# Patient Record
Sex: Female | Born: 1990 | Race: White | Hispanic: Yes | Marital: Single
Health system: Southern US, Community
[De-identification: ages and names within clinical notes are randomized; demographics above are authoritative.]

---

## 2018-04-02 ENCOUNTER — Encounter (HOSPITAL_COMMUNITY): Payer: Self-pay | Admitting: Emergency Medicine

## 2018-04-02 ENCOUNTER — Emergency Department (HOSPITAL_COMMUNITY)
Admission: EM | Admit: 2018-04-02 | Discharge: 2018-04-02 | Disposition: A | Discharge status: Home / Self Care | Payer: Medicaid - Out of State | Attending: Emergency Medicine | Admitting: Emergency Medicine

## 2018-04-02 ENCOUNTER — Emergency Department (HOSPITAL_COMMUNITY): Payer: Medicaid - Out of State

## 2018-04-02 ENCOUNTER — Other Ambulatory Visit: Payer: Self-pay

## 2018-04-02 DIAGNOSIS — R200 Anesthesia of skin: Secondary | ICD-10-CM | POA: Insufficient documentation

## 2018-04-02 DIAGNOSIS — Y998 Other external cause status: Secondary | ICD-10-CM | POA: Insufficient documentation

## 2018-04-02 DIAGNOSIS — W260XXA Contact with knife, initial encounter: Secondary | ICD-10-CM | POA: Insufficient documentation

## 2018-04-02 DIAGNOSIS — R202 Paresthesia of skin: Secondary | ICD-10-CM | POA: Diagnosis not present

## 2018-04-02 DIAGNOSIS — Y92010 Kitchen of single-family (private) house as the place of occurrence of the external cause: Secondary | ICD-10-CM | POA: Insufficient documentation

## 2018-04-02 DIAGNOSIS — S61211A Laceration without foreign body of left index finger without damage to nail, initial encounter: Secondary | ICD-10-CM | POA: Insufficient documentation

## 2018-04-02 DIAGNOSIS — Y93G1 Activity, food preparation and clean up: Secondary | ICD-10-CM | POA: Insufficient documentation

## 2018-04-02 DIAGNOSIS — Z23 Encounter for immunization: Secondary | ICD-10-CM | POA: Insufficient documentation

## 2018-04-02 DIAGNOSIS — S6992XA Unspecified injury of left wrist, hand and finger(s), initial encounter: Secondary | ICD-10-CM | POA: Diagnosis present

## 2018-04-02 MED ORDER — TETANUS-DIPHTH-ACELL PERTUSSIS 5-2.5-18.5 LF-MCG/0.5 IM SUSP
0.5000 mL | Freq: Once | INTRAMUSCULAR | Status: AC
  Administered 2018-04-02: 0.5 mL via INTRAMUSCULAR
  Filled 2018-04-02: qty 0.5

## 2018-04-02 MED ORDER — LIDOCAINE HCL 2 % IJ SOLN
10.0000 mL | Freq: Once | INTRAMUSCULAR | Status: AC
  Administered 2018-04-02: 200 mg via INTRADERMAL
  Filled 2018-04-02: qty 20

## 2018-04-02 NOTE — ED Notes (Signed)
Cut left index finger with a knife , bleeding controlled at this time, small lac to middle of finger pt states pain is a 9/10

## 2018-04-02 NOTE — ED Notes (Signed)
Patient verbalizes understanding of discharge instructions. Opportunity for questioning and answers were provided. Armband removed by staff, pt discharged from ED ambulatory.   

## 2018-04-02 NOTE — ED Notes (Signed)
ED Provider at bedside. 

## 2018-04-02 NOTE — ED Triage Notes (Addendum)
Patient complains of laceration to left index finger while cutting open a bag. 2cm laceration. Hemorrhage controlled. Patient alert, oriented, and in no apparent distress at this time.

## 2018-04-02 NOTE — Discharge Instructions (Signed)
1. Medications: Alternate 600 mg of ibuprofen and 500-1000 mg of Tylenol every 3 hours as needed for pain. Do not exceed 4000 mg of Tylenol daily.  Take ibuprofen with food to avoid upset stomach issues. 2. Treatment: ice for swelling, keep wound clean with warm soap and water and keep bandage dry, do not submerge in water for 24 hours 3. Follow Up: Please return in 7 days to have your stitches/staples removed or sooner if you have concerns.  You may also go to urgent care or your primary care physician.  Return to the emergency department sooner if any concerning signs or symptoms develop such as high fevers, redness, drainage of pus from the wound, or swelling.   WOUND CARE  Keep area clean and dry for 24 hours. Do not remove bandage, if applied.  After 24 hours, remove bandage and wash wound gently with mild soap and warm water. Reapply a new bandage after cleaning wound, if directed.   Continue daily cleansing with soap and water until stitches/staples are removed.  Do not apply any ointments or creams to the wound while stitches/staples are in place, as this may cause delayed healing. Return if you experience any of the following signs of infection: Swelling, redness, pus drainage, streaking, fever >101.0 F  Return if you experience excessive bleeding that does not stop after 15-20 minutes of constant, firm pressure.  

## 2018-04-02 NOTE — ED Provider Notes (Signed)
[facility] EMERGENCY DEPARTMENT Provider Note   CSN: [id number] Arrival date & time: 04/02/18  1242     History   Chief Complaint Chief Complaint  Patient presents with  . Finger Injury    HPI Claire Barber is a 27 y.o. female presents today for evaluation of acute onset, persistent injury to the palmar aspect of her left second digit.  She states that just prior to arrival she was attempting to cut open a package of bacon with a new sharp knife when she sustained a laceration to the palmar aspect of the left index finger.  Bleeding controlled at this time.  She is not up-to-date on her tetanus.  She notes numbness and tingling to the distal fingertip.  Pain is pressure-like sensation which worsens with elevation of the finger and palpation.  The history is provided by the patient.    History reviewed. No pertinent past medical history.  There are no active problems to display for this patient.   History reviewed. No pertinent surgical history.   OB History   None      Home Medications    Prior to Admission medications   Not on File    Family History No family history on file.  Social History Social History   Tobacco Use  . Smoking status: Not on file  Substance Use Topics  . Alcohol use: Not on file  . Drug use: Not on file     Allergies   Patient has no known allergies.   Review of Systems Review of Systems  Constitutional: Negative for fever.  Skin: Positive for wound.  Neurological: Positive for numbness. Negative for weakness.     Physical Exam Updated Vital Signs BP 124/85 (BP Location: Right Arm)   Pulse 85   Temp 99.2 F (37.3 C) (Oral)   Resp 16   SpO2 100%   Physical Exam  Constitutional: She appears well-developed and well-nourished. No distress.  HENT:  Head: Normocephalic and atraumatic.  Eyes: Conjunctivae are normal. Right eye exhibits no discharge. Left eye exhibits no discharge.  Neck: No JVD present. No  tracheal deviation present.  Cardiovascular: Normal rate.  Pulmonary/Chest: Effort normal.  Abdominal: She exhibits no distension.  Musculoskeletal: She exhibits no edema.  1.5cm laceration to the palmar aspect of the left second digit along the distal phalanx.  Bleeding controlled.  5/5 strength of wrist and digits with flexion extension against resistance.  No underlying crepitus noted.  No surrounding erythema or abnormal drainage.  Neurological: She is alert.  Fluent speech, no facial droop, altered sensation to the distal fingertip of the left second digit.  Sensation otherwise intact to soft touch of hands.  Skin: Skin is warm and dry. No erythema.  Psychiatric: She has a normal mood and affect. Her behavior is normal.  Nursing note and vitals reviewed.    ED Treatments / Results  Labs (all labs ordered are listed, but only abnormal results are displayed) Labs Reviewed - No data to display  EKG None  Radiology Dg Finger Index Left  Result Date: 04/02/2018 CLINICAL DATA:  Deep laceration involving the distal aspect of the left index finger. EXAM: LEFT INDEX FINGER 2+V COMPARISON:  None. FINDINGS: Apparent soft tissue laceration involving the palmar distal aspect of the second digit. No associated fracture or radiopaque foreign body. Joint spaces are preserved. No erosions. IMPRESSION: Laceration involving the palmar distal aspect of the second digit without associated fracture or radiopaque foreign body. Electronically Signed  By: Simonne ComeJohn  Watts M.D.   On: 04/02/2018 13:32    Procedures .Marland Kitchen.Laceration Repair Date/Time: 04/02/2018 5:02 PM Performed by: Jeanie SewerFawze, Traniya Prichett A, PA-C Authorized by: Jeanie SewerFawze, Ayrabella Labombard A, PA-C   Consent:    Consent obtained:  Verbal   Consent given by:  Patient and parent   Risks discussed:  Infection, pain, poor cosmetic result and poor wound healing Anesthesia (see MAR for exact dosages):    Anesthesia method:  Local infiltration   Local anesthetic:  Lidocaine 2%  w/o epi Laceration details:    Location:  Finger   Finger location:  L index finger   Length (cm):  1.5   Depth (mm):  3 Repair type:    Repair type:  Simple Pre-procedure details:    Preparation:  Patient was prepped and draped in usual sterile fashion and imaging obtained to evaluate for foreign bodies Exploration:    Hemostasis achieved with:  Direct pressure and tourniquet   Wound exploration: wound explored through full range of motion and entire depth of wound probed and visualized     Wound extent: areolar tissue violated     Wound extent: no tendon damage noted and no underlying fracture noted   Treatment:    Area cleansed with:  Betadine and saline   Amount of cleaning:  Extensive   Irrigation method:  Pressure wash Skin repair:    Repair method:  Sutures   Suture size:  4-0   Suture material:  Prolene   Suture technique:  Simple interrupted   Number of sutures:  5 Approximation:    Approximation:  Loose Post-procedure details:    Dressing:  Splint for protection and non-adherent dressing   Patient tolerance of procedure:  Tolerated well, no immediate complications   (including critical care time)  Medications Ordered in ED Medications  lidocaine (XYLOCAINE) 2 % (with pres) injection 200 mg (200 mg Intradermal Given by Other 04/02/18 1552)  Tdap (BOOSTRIX) injection 0.5 mL (0.5 mLs Intramuscular Given 04/02/18 1550)     Initial Impression / Assessment and Plan / ED Course  I have reviewed the triage vital signs and the nursing notes.  Pertinent labs & imaging results that were available during my care of the patient were reviewed by me and considered in my medical decision making (see chart for details).    Patient with laceration to the palmar aspect of the left second digit.  She is afebrile, vital signs are stable.  She is nontoxic in appearance.  She is neurovascularly intact.  Radiographs show no underlying fracture or foreign body.  Pressure irrigation  performed. Wound explored and base of wound visualized in a bloodless field without evidence of foreign body.  Laceration occurred < 8 hours prior to repair which was well tolerated.  Tdap updated.  Pt has no comorbidities to effect normal wound healing. Pt discharged without antibiotics.  Discussed suture home care with patient and answered questions. Pt to follow-up for wound check and suture removal in 7 days; they are to return to the ED sooner for signs of infection.  [place] today.  She does have a PCP and I recommended setting up an appointment in 1 week for suture removal or presentation to an urgent care or ED for suture removal.  Patient and patient's fianc verbalized understanding of and agreement with plan and patient is stable for discharge home at this time..    Final Clinical Impressions(s) / ED Diagnoses   Final diagnoses:  Laceration  of left index finger without foreign body without damage to nail, initial encounter    ED Discharge Orders    None       Bennye Alm 04/02/18 1723    Marily Memos, MD 04/03/18 2007

## 2019-05-29 IMAGING — DX DG FINGER INDEX 2+V*L*
3 series · 3 of 3 positions shown · non-contrast
Comparison: None.

CLINICAL DATA: Deep laceration involving the distal aspect of the
left index finger.

EXAM:
LEFT INDEX FINGER 2+V

[finger ap]
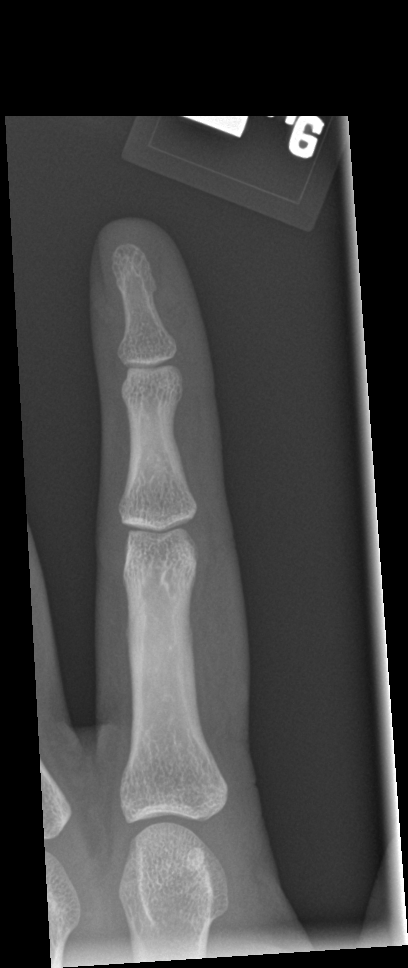

[finger obl]
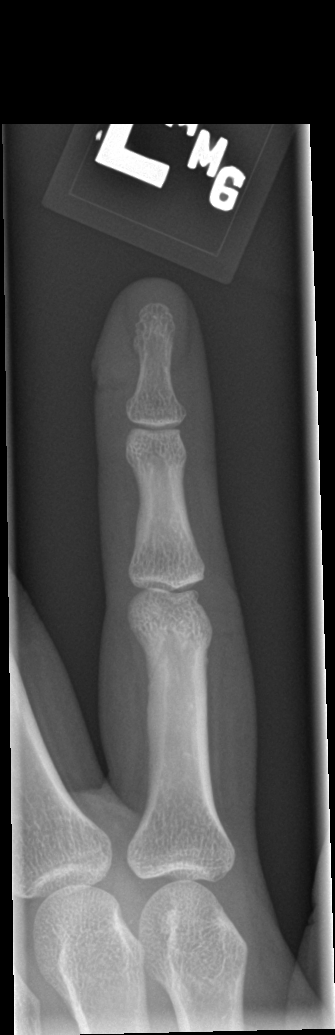

[finger lat]
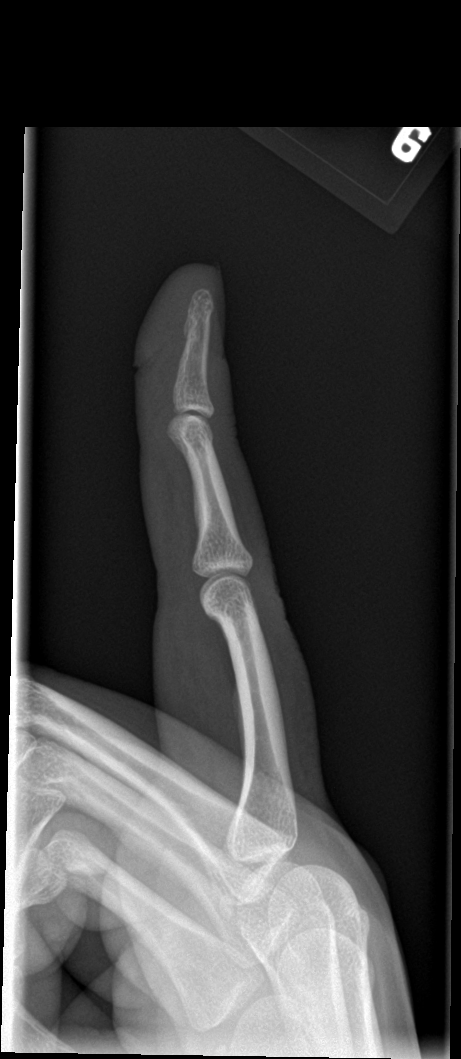

[3 of 3 positions shown; findings below may reference images not displayed]

FINDINGS: Apparent soft tissue laceration involving the palmar distal aspect
of the second digit. No associated fracture or radiopaque foreign
body. Joint spaces are preserved. No erosions.
IMPRESSION: Laceration involving the palmar distal aspect of the second digit
without associated fracture or radiopaque foreign body.
